# Patient Record
Sex: Female | Born: 1945 | Race: White | Hispanic: No | Marital: Married | Smoking: Never smoker
Health system: Southern US, Community
[De-identification: ages and names within clinical notes are randomized; demographics above are authoritative.]

## PROBLEM LIST (undated history)

## (undated) HISTORY — PX: APPENDECTOMY: SHX54

---

## 2016-01-02 DIAGNOSIS — E785 Hyperlipidemia, unspecified: Secondary | ICD-10-CM | POA: Insufficient documentation

## 2016-01-02 DIAGNOSIS — I493 Ventricular premature depolarization: Secondary | ICD-10-CM | POA: Insufficient documentation

## 2016-01-02 DIAGNOSIS — R739 Hyperglycemia, unspecified: Secondary | ICD-10-CM | POA: Insufficient documentation

## 2016-01-02 DIAGNOSIS — M810 Age-related osteoporosis without current pathological fracture: Secondary | ICD-10-CM | POA: Insufficient documentation

## 2016-01-04 DIAGNOSIS — E559 Vitamin D deficiency, unspecified: Secondary | ICD-10-CM | POA: Insufficient documentation

## 2018-01-08 DIAGNOSIS — I10 Essential (primary) hypertension: Secondary | ICD-10-CM | POA: Insufficient documentation

## 2019-01-18 DIAGNOSIS — L309 Dermatitis, unspecified: Secondary | ICD-10-CM | POA: Insufficient documentation

## 2020-11-21 DIAGNOSIS — K760 Fatty (change of) liver, not elsewhere classified: Secondary | ICD-10-CM | POA: Insufficient documentation

## 2021-03-01 ENCOUNTER — Encounter: Payer: Self-pay | Admitting: Internal Medicine

## 2021-03-19 DIAGNOSIS — R768 Other specified abnormal immunological findings in serum: Secondary | ICD-10-CM | POA: Insufficient documentation

## 2021-03-19 LAB — COMPREHENSIVE METABOLIC PANEL WITH GFR
Albumin: 4.1 (ref 3.5–5.0)
Calcium: 10.3 (ref 8.7–10.7)
GFR calc Af Amer: 87
GFR calc non Af Amer: 75
Globulin: 3.1

## 2021-03-19 LAB — BASIC METABOLIC PANEL WITH GFR
BUN: 13 (ref 4–21)
CO2: 26 — AB (ref 13–22)
Chloride: 96 — AB (ref 99–108)
Creatinine: 0.8 (ref 0.5–1.1)
Glucose: 99
Potassium: 4.2 (ref 3.4–5.3)
Sodium: 136 — AB (ref 137–147)

## 2021-03-19 LAB — HEPATIC FUNCTION PANEL
ALT: 18 (ref 7–35)
AST: 39 — AB (ref 13–35)
Alkaline Phosphatase: 58 (ref 25–125)
Bilirubin, Direct: 0.2 (ref 0.01–0.4)
Bilirubin, Total: 0.9

## 2021-03-19 LAB — CBC AND DIFFERENTIAL
HCT: 42 (ref 36–46)
Hemoglobin: 14 (ref 12.0–16.0)
Platelets: 121 — AB (ref 150–399)
WBC: 7.7

## 2021-03-19 LAB — LIPID PANEL
Cholesterol: 231 — AB (ref 0–200)
HDL: 84 — AB (ref 35–70)
LDL Cholesterol: 130
LDl/HDL Ratio: 1.5
Triglycerides: 86 (ref 40–160)

## 2021-03-19 LAB — CBC: RBC: 4.71 (ref 3.87–5.11)

## 2021-03-19 LAB — VITAMIN D 25 HYDROXY (VIT D DEFICIENCY, FRACTURES): Vit D, 25-Hydroxy: 250

## 2021-05-11 ENCOUNTER — Encounter: Payer: Self-pay | Admitting: Nurse Practitioner

## 2021-06-07 ENCOUNTER — Other Ambulatory Visit: Payer: Self-pay

## 2021-06-07 ENCOUNTER — Encounter: Payer: Self-pay | Admitting: Internal Medicine

## 2021-06-12 LAB — ANA: ANA SCREEN, IFA: POSITIVE

## 2021-06-12 NOTE — Progress Notes (Signed)
06/12/2021 Katherine Horne [id number] [birth year]   CHIEF COMPLAINT: Schedule a colonoscopy   HISTORY OF PRESENT ILLNESS: Katherine Horne is a 75 year old female with a past medical history of anxiety, hypertension, hyperlipidemia, ITP, osteoporosis and elevated LFTs.  S/P bright breast biopsy 2013 and C section 1981. She presents to our office today to schedule a screening colonoscopy as advised by her primary care physician Dr. Margaretha Sheffield. She also presents for further evaluation regarding elevated LFTs and elevated AMA levels as requested by her rheumatologist Dr. Ronnette Hila. She underwent a colonoscopy by Dr. Posey Pronto in [place] 11/2007 which she reported was normal, no polyps. She was due for a repeat  colonoscopy in 2019 which was not done due to the Covid 19 pandemic. She is passing normal Bms. Not daily. Soft to formed brown. No blood or black stools. No known family history of colorectal cancer.   She underwent laboratory studies 08/2020 which she reported showed elevated LFT. She underwent an abdominal sonogram at some point which showed evidence of a fatty liver ( abdominal sonogram report not in Epic or care everwhere). She recently underwent further laboratory studies by her PCP which showed a positive ANA level so she was referred to rheumatologist Dr. Scarlette Shorts.   Laboratory studies done by Dr. Scarlette Shorts 06/05/2021: WBC 9.3. Hg 13.7. HCT 41.6. PLT 134. BUN 10. Cr. 0.82. Na 136. K 4.4. Albumin 4.6. T. Bili 0.6. Alk phos 59. AST 35. ALT 20. Anti centromere B antibody > 8 (0.0 - 0.9). Hep B surface antigen negative. Hep B surface antibody nonreactive. Hep B core total negative. Hep C antibody < 0.1. CRP 14. TSH 1.730. B12 441. AMA 108.7 ( 0.0 - 20.0). Sed rate 16.  Laboratory studies 12/13/2020: Ferritin 303. Iron 120.Iron saturation 48%. TIBC 252. ANA positive. SMA < 1:20. A1AT 142. Ceruloplasmin 25. Alk phos 61. AST 123. ALT 83. Laboratory studies 11/14/2020: Alk phos 64. AST 140. ALT  95. Laboratory studies 10/16/2020: Alk phos 67. AST 140. ALT 68. T. Bili 0.5. Laboratory studies 09/11/2020: Alk phos 61. AST 78. ALT 39. T. Bili 0.6.   No family history of liver disease. No alcohol use.   She complains of having dysphagia which occurs once every 3 to 4 months for the past 2 years. She describes having food such a hamburger or bead which gets stuck to the upper esophagus which typically occurs when she eats while in her recliner. She is concerned enough about these episodes and questions if she needs to have her esophagus stretched. She denies ever having an EGD.   Social History: She is married. She is retired. He has 2 sons. She is retired. Infrequent alcohol use. No drug use.   Family History: Father with history of lung disease. Mother had heart issues, idiopathic pulmonary fibrosis. Son is diabetic with ESRD.     Outpatient Encounter Medications as of 06/13/2021  Medication Sig   denosumab (PROLIA) 60 MG/ML SOSY injection Prolia 60 mg/mL subcutaneous syringe  Inject 1 mL by subcutaneous route.   LORazepam (ATIVAN) 0.5 MG tablet 0.5 mg. Take 1 tablet every day as needed.   metoprolol succinate (TOPROL-XL) 25 MG 24 hr tablet Take 25 mg by mouth daily.   simvastatin (ZOCOR) 40 MG tablet Take 40 mg by mouth at bedtime.   telmisartan (MICARDIS) 40 MG tablet Take 40 mg by mouth daily.   No facility-administered encounter medications on file as of 06/13/2021.    REVIEW OF SYSTEMS:  Gen:  Denies fever, sweats or chills. No weight loss.  CV: Denies chest pain, palpitations or edema. Resp: Denies cough, shortness of breath of hemoptysis.  GI: See HPI.  GU : Denies urinary burning, blood in urine, increased urinary frequency or incontinence. MS: Denies joint pain, muscles aches or weakness. Derm: + rash and itchiness. No skin lesions or unhealing ulcers. No jaundice.  Psych: + Anxiety.  Heme: Denies bruising, bleeding. Neuro:  Denies headaches, dizziness or  paresthesias. Endo:  Denies any problems with DM, thyroid or adrenal function.   PHYSICAL EXAM: BP 130/60   Pulse (!) 50   Ht 5' 4" (1.626 m)   Wt 125 lb 9.6 oz (57 kg)   BMI 21.56 kg/m  General: 75 year old female in no acute distress. Head: Normocephalic and atraumatic. Eyes:  Sclerae non-icteric, conjunctive pink. Ears: Normal auditory acuity. Mouth: Upper and lower dentures. No ulcers or lesions.  Neck: Supple, no lymphadenopathy or thyromegaly.  Lungs: Clear bilaterally to auscultation without wheezes, crackles or rhonchi. Heart: Regular rate and rhythm. No murmur, rub or gallop appreciated.  Abdomen: Soft, nontender, nondistended. No masses. No hepatosplenomegaly. Normoactive bowel sounds x 4 quadrants. RLQ and central abdominal scars intact.  Rectal: Deferred.  Musculoskeletal: Symmetrical with no gross deformities. Skin: Warm and dry. No rash or lesions on visible extremities. Extremities: No edema. Neurological: Alert oriented x 4, no focal deficits.  Psychological:  Alert and cooperative. Normal mood and affect.  ASSESSMENT AND PLAN: 22) 75 year old female presents to schedule a screening colonoscopy. Normal colonoscopy reported in 2009, records not available for review.  -Colonoscopy benefits and risks discussed including risk with sedation, risk of bleeding, perforation and infection   2) History of hepatic steatosis. Elevated LFTs 08/2020 -> normal LFTs 06/05/2021 with elevated antimitochondrial antibody level. -Abdominal MRI/MRCP  -Liver biopsy deferred for now -Further recommendations to be determined after abdominal MRI/MRCP results reviewed  -Repeat hepatic panel with IgG and GGT level in 4 weeks   3) Dysphagia, intermittent which occurs mostly when eating in a recliner -Avoid eating in a recliner  -EGD with possible esophageal dilatations at time of colonoscopy benefits and risks discussed including risk with sedation, risk of bleeding, perforation and infection    4) Bradycardia, asymptomatic on Metoprolol for HTN  5) Thrombocytopenia, previously diagnosed with ITP -Await abdominal MRI/MRCP results to assess for any evidence of cirrhosis           CC:  No ref. provider found

## 2021-06-13 ENCOUNTER — Encounter: Payer: Self-pay | Admitting: Nurse Practitioner

## 2021-06-13 ENCOUNTER — Ambulatory Visit (INDEPENDENT_AMBULATORY_CARE_PROVIDER_SITE_OTHER): Payer: Medicare Other | Admitting: Nurse Practitioner

## 2021-06-13 VITALS — BP 130/60 | HR 50 | Ht 64.0 in | Wt 125.6 lb

## 2021-06-13 DIAGNOSIS — Z1211 Encounter for screening for malignant neoplasm of colon: Secondary | ICD-10-CM

## 2021-06-13 DIAGNOSIS — R7989 Other specified abnormal findings of blood chemistry: Secondary | ICD-10-CM

## 2021-06-13 DIAGNOSIS — R1310 Dysphagia, unspecified: Secondary | ICD-10-CM | POA: Diagnosis not present

## 2021-06-13 MED ORDER — PLENVU 140 G PO SOLR: ORAL | 0 refills | Status: DC

## 2021-06-13 NOTE — Progress Notes (Signed)
RADIOLOGY SCHEDULING REQUEST FOR Abd MRI MRCP W/WO contrast [facility] Scheduling via secure staff message.

## 2021-06-13 NOTE — Patient Instructions (Signed)
PROCEDURES: You have been scheduled for a colonoscopy. Please follow the written instructions given to you at your visit today. Please pick up your prep supplies at the pharmacy within the next 1-3 days. If you use inhalers (even only as needed), please bring them with you on the day of your procedure.  IMAGING: You will be contacted by [facility] Scheduling (Your caller ID will indicate phone # [phone]) in the next 2 days to schedule your Abdominal MRI. If you have not heard from them within 2 business days, please call [facility] Scheduling at [phone] to follow up on the status of your appointment.    It was great seeing you today! Thank you for entrusting me with your care and choosing [facility].  Arnaldo Natal, CRNP  The [facility] providers would like to encourage you to use [employer] to communicate with providers for non-urgent requests or questions.  Due to long hold times on the telephone, sending your provider a message by [facility] may be faster and more efficient way to get a response. Please allow 48 business hours for a response.  Please remember that this is for non-urgent requests/questions.  If you are age 54 or older, your body mass index should be between 23-30. Your Body mass index is 21.56 kg/m. If this is out of the aforementioned range listed, please consider follow up with your Primary Care Provider.  If you are age 75 or younger, your body mass index should be between 19-25. Your Body mass index is 21.56 kg/m. If this is out of the aformentioned range listed, please consider follow up with your Primary Care Provider.

## 2021-06-27 ENCOUNTER — Other Ambulatory Visit: Payer: Self-pay | Admitting: Nurse Practitioner

## 2021-06-27 ENCOUNTER — Ambulatory Visit (HOSPITAL_COMMUNITY)
Admission: RE | Admit: 2021-06-27 | Discharge: 2021-06-27 | Disposition: A | Discharge status: Home / Self Care | Payer: Medicare Other | Source: Ambulatory Visit | Attending: Nurse Practitioner | Admitting: Nurse Practitioner

## 2021-06-27 ENCOUNTER — Other Ambulatory Visit: Payer: Self-pay

## 2021-06-27 DIAGNOSIS — R1310 Dysphagia, unspecified: Secondary | ICD-10-CM | POA: Diagnosis present

## 2021-06-27 DIAGNOSIS — Z1211 Encounter for screening for malignant neoplasm of colon: Secondary | ICD-10-CM | POA: Insufficient documentation

## 2021-06-27 DIAGNOSIS — R7989 Other specified abnormal findings of blood chemistry: Secondary | ICD-10-CM | POA: Diagnosis present

## 2021-06-27 IMAGING — MR MR 3D RECON AT SCANNER
17 of 21 series · 38 of 48 positions shown · IV contrast (6ml GADAVIST)
Comparison: None.

CLINICAL DATA: Elevated liver function tests. Elevated anti
microbial antibodies.

EXAM:
MRI ABDOMEN WITHOUT AND WITH CONTRAST (INCLUDING MRCP)
TECHNIQUE: Multiplanar multisequence MR imaging of the abdomen was performed
both before and after the administration of intravenous contrast.
Heavily T2-weighted images of the biliary and pancreatic ducts were
obtained, and three-dimensional MRCP images were rendered by post
processing.
CONTRAST:  6mL GADAVIST GADOBUTROL 1 MMOL/ML IV SOLN

[Series 2: DWI · axial · 6.0mm · 1.36mm/px · z∈[-149,+74]mm · 2 of 64 slices shown (1 of 2)]
[im 1/64]
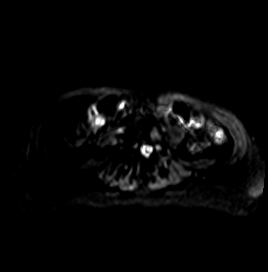
[im 64/64]
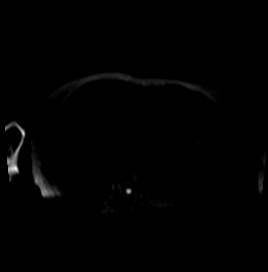

[Series 3: DWI · axial · 6.0mm · 1.36mm/px · 1 of 32 slices shown (2 of 2)]
[im 1/32]
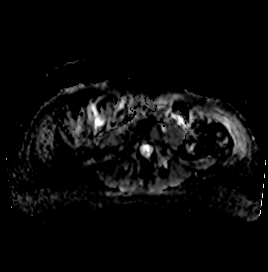

[Series 4: T2 fat-sat · axial · 6.0mm · 1.14mm/px · 1 of 30 slices shown]
[im 1/30]
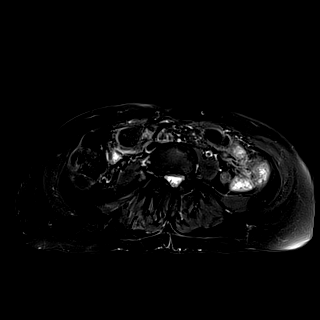

[Series 7: cor_3d_spc_trig · coronal · 1.0mm · 0.49mm/px · 3 of 72 slices shown]
[im 1/72]
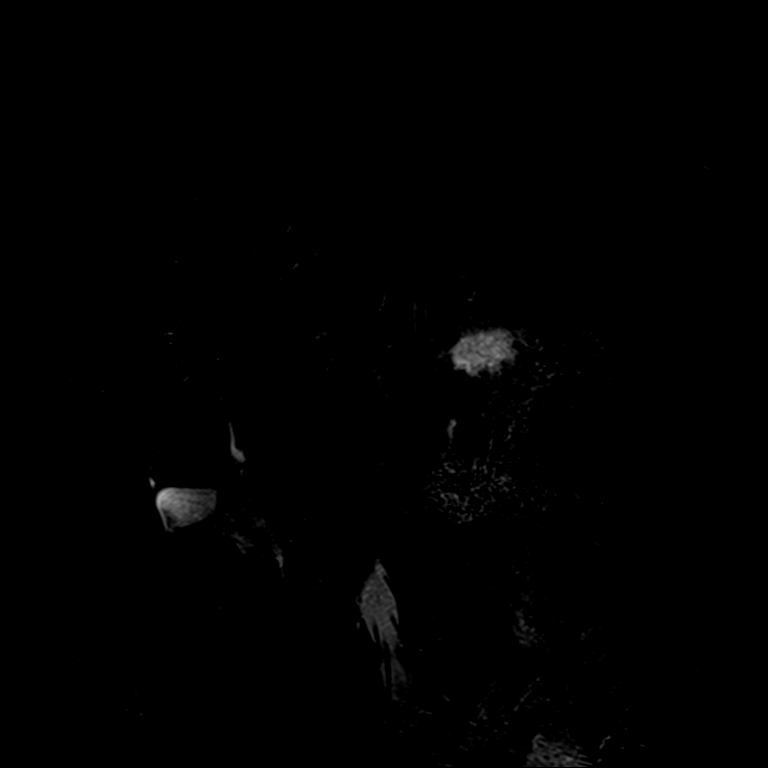
[im 36/72]
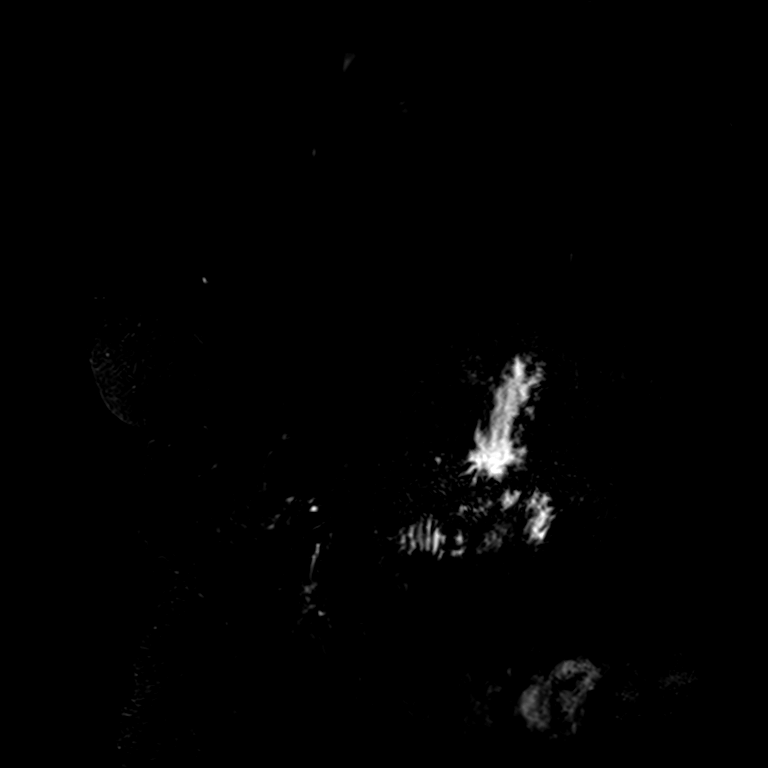
[im 72/72]
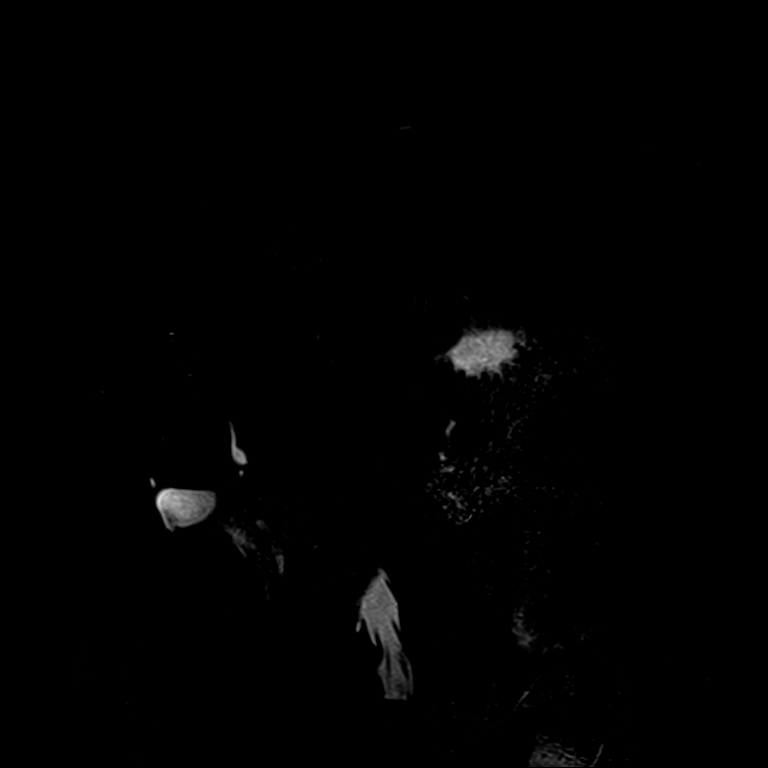

[Series 11: T2 · coronal · 6.0mm · 1.48mm/px · 1 of 33 slices shown (1 of 2)]
[im 1/33]
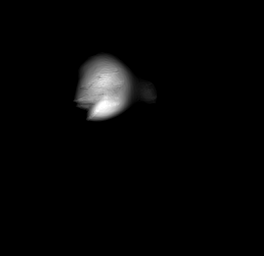

[Series 12: T1 · axial · 3.0mm · 1.09mm/px · z∈[-143,+70]mm · 3 of 72 slices shown (1 of 2)]
[im 1/72]
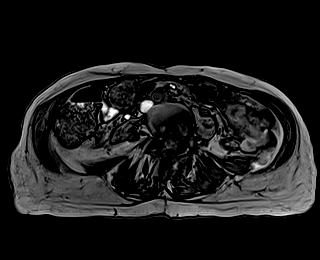
[im 36/72]
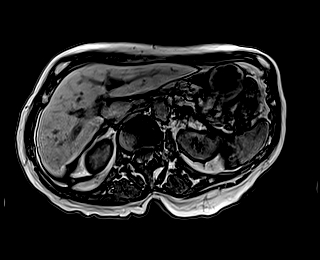
[im 72/72]
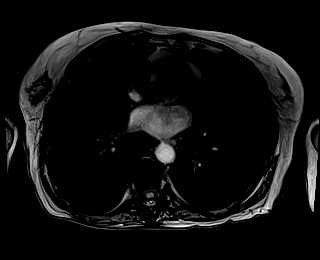

[Series 13: T1 · axial · 3.0mm · 1.09mm/px · z∈[-143,+70]mm · 3 of 72 slices shown (2 of 2)]
[im 1/72]
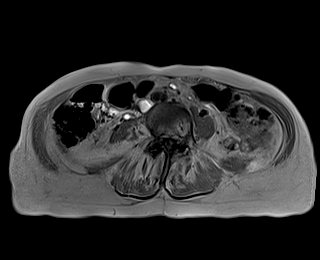
[im 36/72]
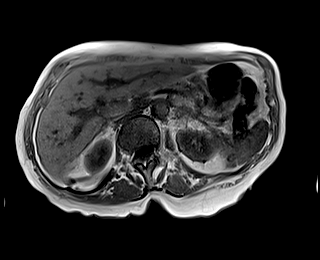
[im 72/72]
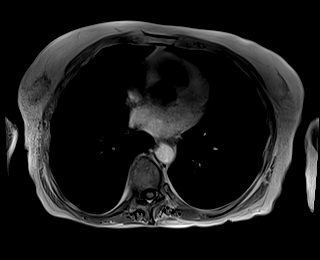

[Series 14: cor obl thk · sagittal · 50.0mm · 0.78mm/px · 1 of 9 slices shown]
[im 1/9]
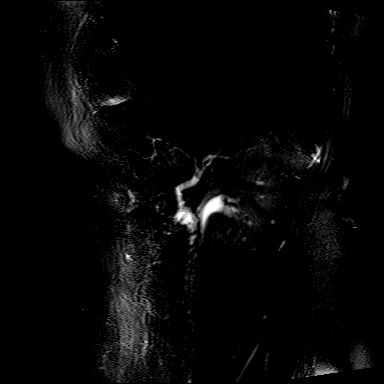

[Series 15: T2 · axial · 6.0mm · 1.37mm/px · 1 of 30 slices shown (2 of 2)]
[im 1/30]
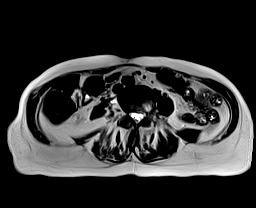

[Series 17: T1 dynamic · axial · 3.0mm · 1.09mm/px · z∈[-142,+71]mm · 3 of 72 slices shown (1 of 8)]
[im 1/72]
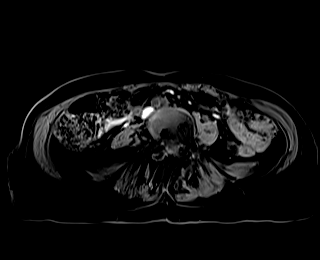
[im 36/72]
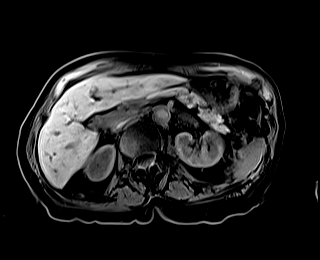
[im 72/72]
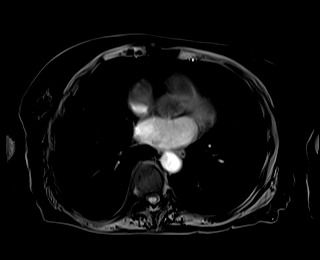

[Series 21: T1 dynamic · axial · 3.0mm · 1.09mm/px · z∈[-142,+71]mm · 3 of 72 slices shown (2 of 8)]
[im 1/72]
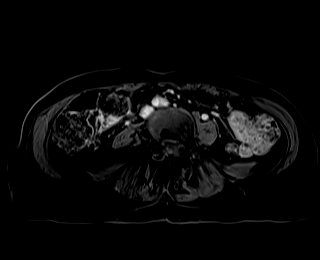
[im 36/72]
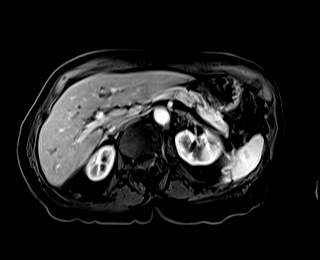
[im 72/72]
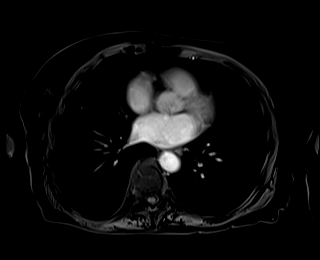

[Series 22: T1 dynamic · axial · 3.0mm · 1.09mm/px · z∈[-142,+71]mm · 3 of 72 slices shown (3 of 8)]
[im 1/72]
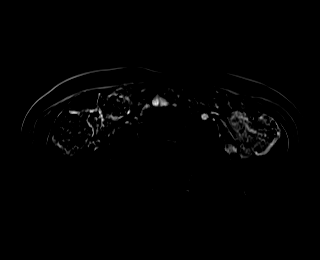
[im 36/72]
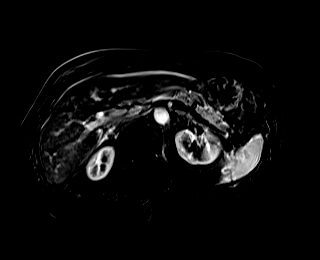
[im 72/72]
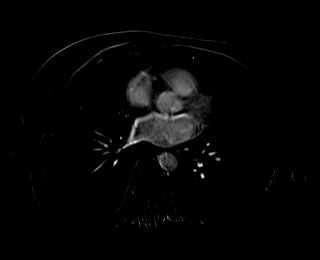

[Series 25: T1 dynamic · axial · 3.0mm · 1.09mm/px · z∈[-142,+71]mm · 3 of 72 slices shown (4 of 8)]
[im 1/72]
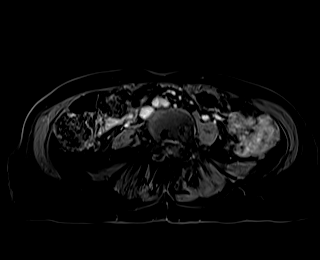
[im 36/72]
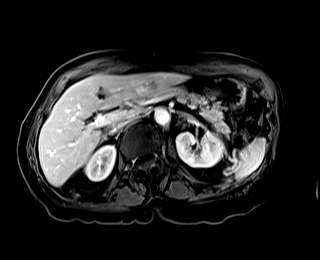
[im 72/72]
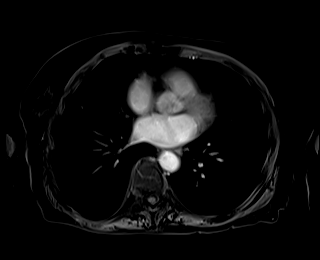

[Series 26: T1 dynamic · axial · 3.0mm · 1.09mm/px · z∈[-142,+71]mm · 3 of 72 slices shown (5 of 8)]
[im 1/72]
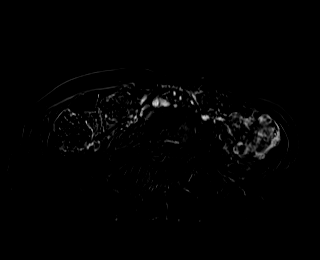
[im 36/72]
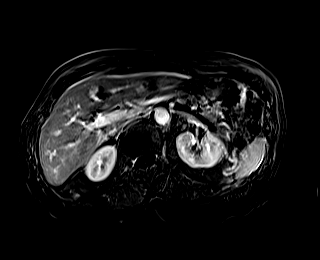
[im 72/72]
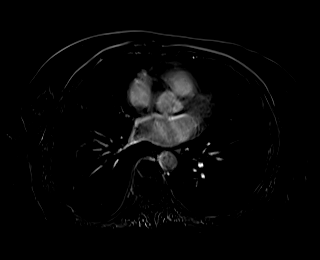

[Series 29: T1 dynamic · axial · 3.0mm · 1.09mm/px · z∈[-142,+71]mm · 3 of 72 slices shown (6 of 8)]
[im 1/72]
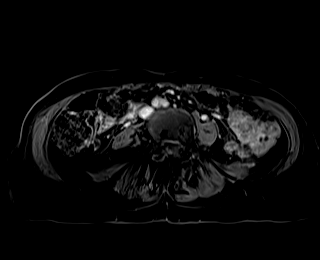
[im 36/72]
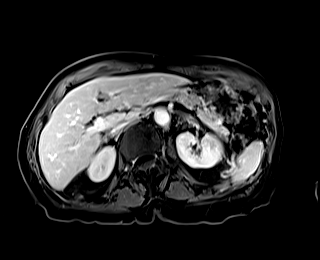
[im 72/72]
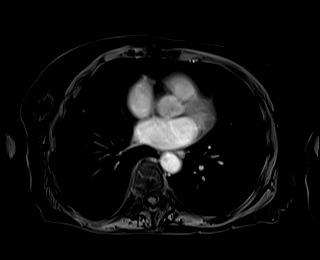

[Series 30: T1 dynamic · axial · 3.0mm · 1.09mm/px · z∈[-142,+71]mm · 3 of 72 slices shown (7 of 8)]
[im 1/72]
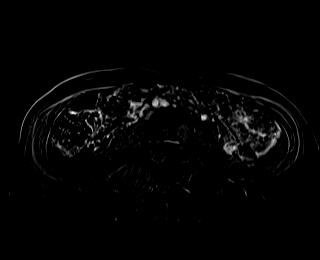
[im 36/72]
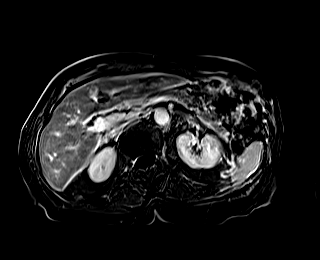
[im 72/72]
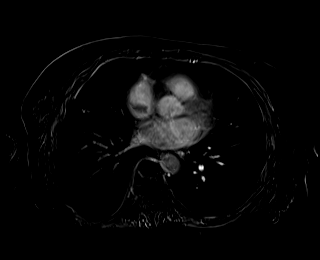

[Series 32: T1 dynamic · coronal · 5.0mm · 1.41mm/px · 1 of 44 slices shown (8 of 8)]
[im 1/44]
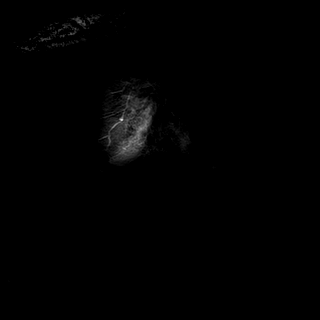

[38 of 48 positions shown; findings below may reference images not displayed]

FINDINGS: Lower chest: No acute findings.

Hepatobiliary: Hepatic parenchyma shows normal signal intensity. No
evidence of steatosis, and no gross morphologic findings of
cirrhosis identified. No foci of restricted diffusion is seen.
Gallbladder is unremarkable. No evidence of dilatation or strictures
involving the common bile duct or intrahepatic bile ducts.

Pancreas: No mass or inflammatory changes. No evidence of pancreatic
ductal dilatation or pancreas divisum.

Spleen:  Within normal limits in size and appearance.

Adrenals/Urinary Tract: No masses identified. A tiny sub-cm left
renal cyst is noted. No evidence of hydronephrosis.

Stomach/Bowel: Visualized portion unremarkable.

Vascular/Lymphatic: No pathologically enlarged lymph nodes
identified. No acute vascular findings.

Other:  None.

Musculoskeletal:  No suspicious bone lesions identified.
IMPRESSION: Negative. No hepatobiliary abnormality or other significant findings
identified.

## 2021-06-27 IMAGING — MR MR ABDOMEN WO/W CM MRCP
17 of 21 series · 38 of 48 positions shown · IV contrast (gadavist)
Comparison: None.

CLINICAL DATA: Elevated liver function tests. Elevated anti
microbial antibodies.

EXAM:
MRI ABDOMEN WITHOUT AND WITH CONTRAST (INCLUDING MRCP)
TECHNIQUE: Multiplanar multisequence MR imaging of the abdomen was performed
both before and after the administration of intravenous contrast.
Heavily T2-weighted images of the biliary and pancreatic ducts were
obtained, and three-dimensional MRCP images were rendered by post
processing.
CONTRAST:  6mL GADAVIST GADOBUTROL 1 MMOL/ML IV SOLN

[Series 2: DWI · axial · 6.0mm · 1.36mm/px · z∈[-149,+74]mm · 2 of 64 slices shown (1 of 2)]
[im 1/64]
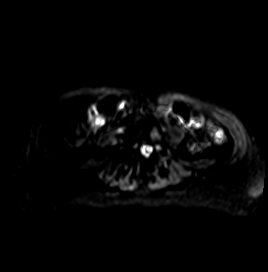
[im 64/64]
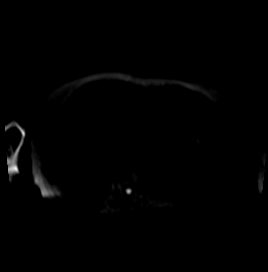

[Series 3: DWI · axial · 6.0mm · 1.36mm/px · 1 of 32 slices shown (2 of 2)]
[im 1/32]
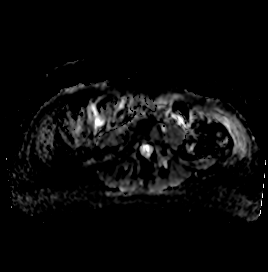

[Series 4: T2 fat-sat · axial · 6.0mm · 1.14mm/px · 1 of 30 slices shown]
[im 1/30]
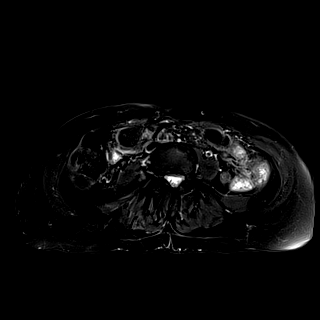

[Series 7: cor_3d_spc_trig · coronal · 1.0mm · 0.49mm/px · 3 of 72 slices shown]
[im 1/72]
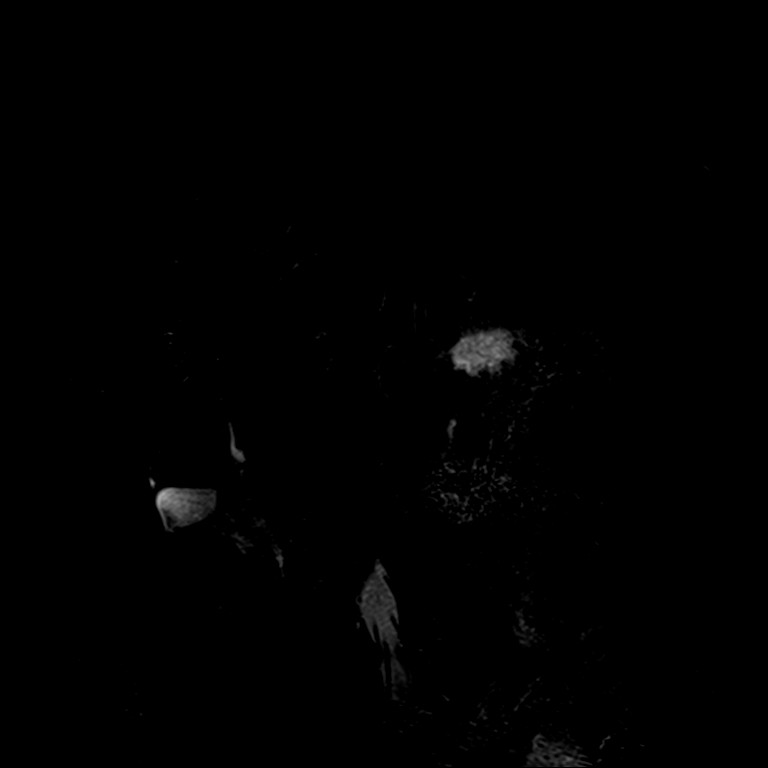
[im 36/72]
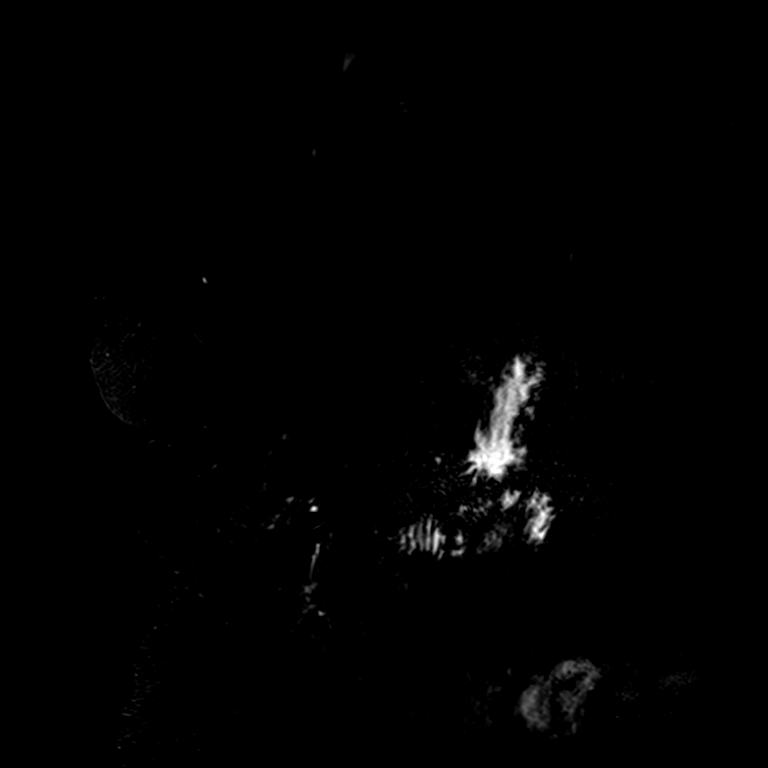
[im 72/72]
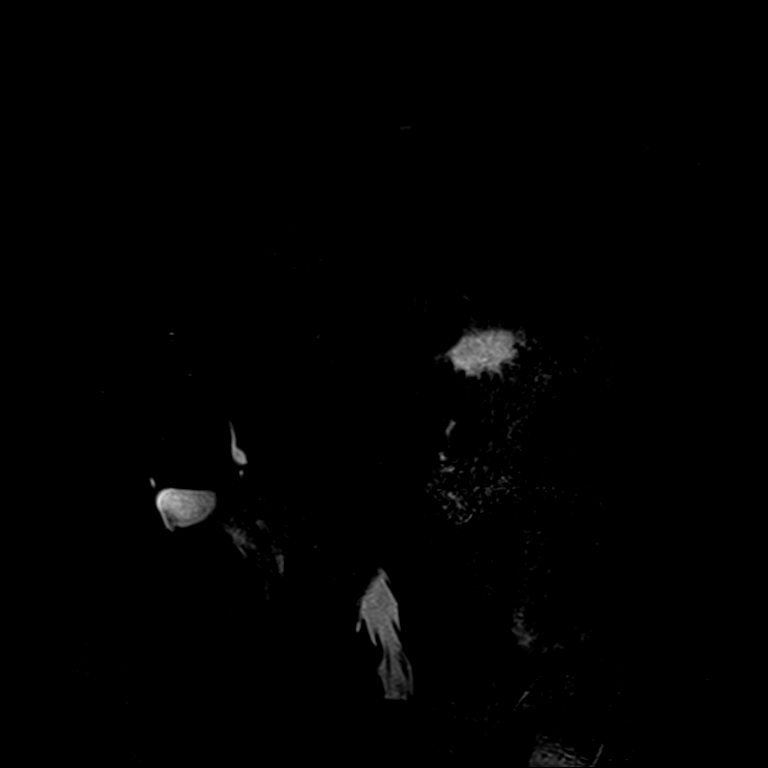

[Series 11: T2 · coronal · 6.0mm · 1.48mm/px · 1 of 33 slices shown (1 of 2)]
[im 1/33]
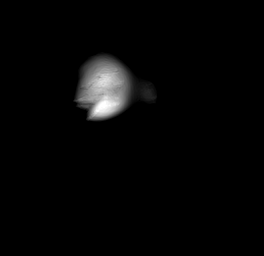

[Series 12: T1 · axial · 3.0mm · 1.09mm/px · z∈[-143,+70]mm · 3 of 72 slices shown (1 of 2)]
[im 1/72]
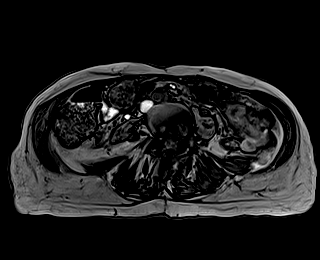
[im 36/72]
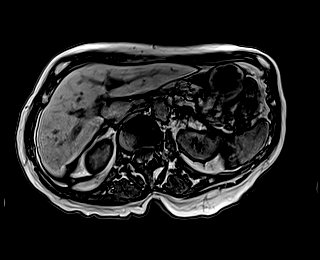
[im 72/72]
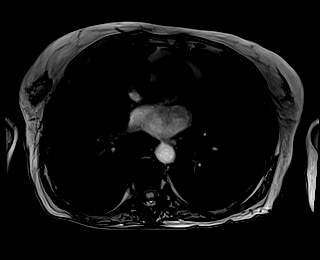

[Series 13: T1 · axial · 3.0mm · 1.09mm/px · z∈[-143,+70]mm · 3 of 72 slices shown (2 of 2)]
[im 1/72]
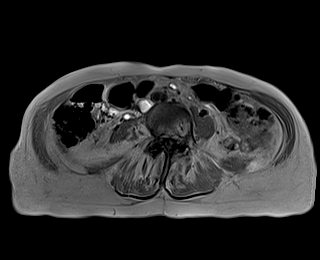
[im 36/72]
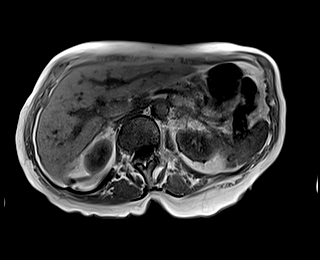
[im 72/72]
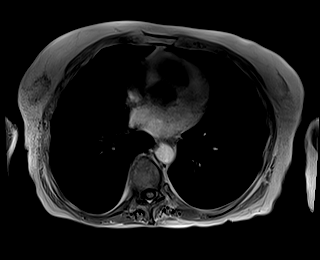

[Series 14: cor obl thk · sagittal · 50.0mm · 0.78mm/px · 1 of 9 slices shown]
[im 1/9]
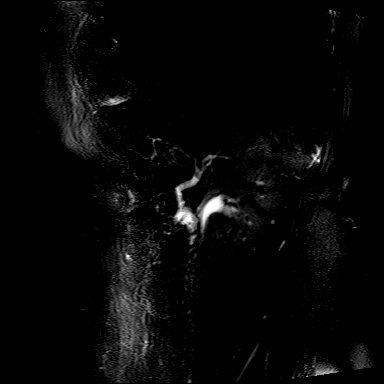

[Series 15: T2 · axial · 6.0mm · 1.37mm/px · 1 of 30 slices shown (2 of 2)]
[im 1/30]
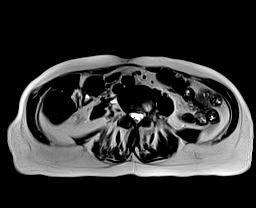

[Series 17: T1 dynamic · axial · 3.0mm · 1.09mm/px · z∈[-142,+71]mm · 3 of 72 slices shown (1 of 8)]
[im 1/72]
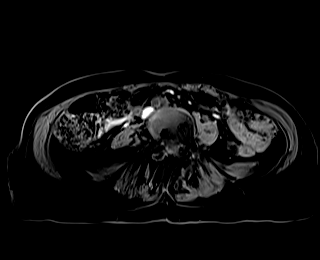
[im 36/72]
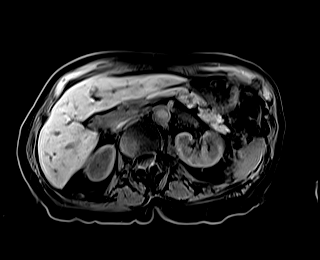
[im 72/72]
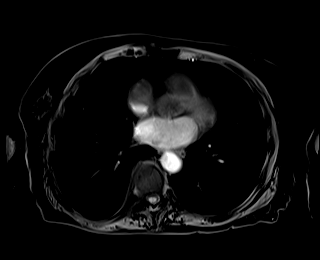

[Series 21: T1 dynamic · axial · 3.0mm · 1.09mm/px · z∈[-142,+71]mm · 3 of 72 slices shown (2 of 8)]
[im 1/72]
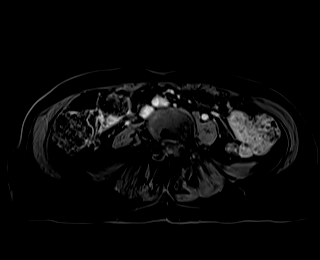
[im 36/72]
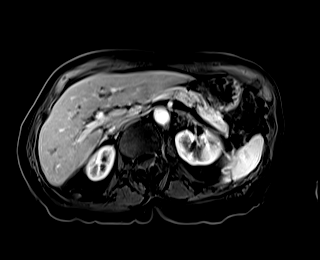
[im 72/72]
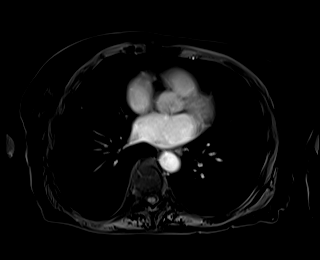

[Series 22: T1 dynamic · axial · 3.0mm · 1.09mm/px · z∈[-142,+71]mm · 3 of 72 slices shown (3 of 8)]
[im 1/72]
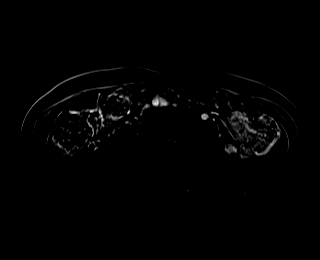
[im 36/72]
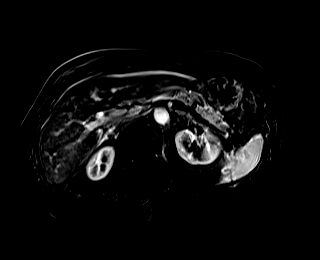
[im 72/72]
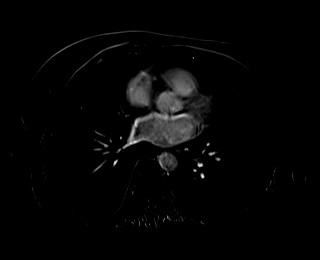

[Series 25: T1 dynamic · axial · 3.0mm · 1.09mm/px · z∈[-142,+71]mm · 3 of 72 slices shown (4 of 8)]
[im 1/72]
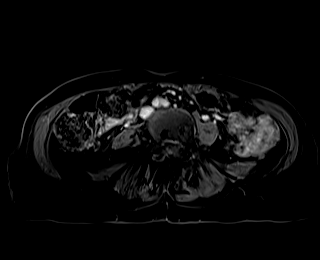
[im 36/72]
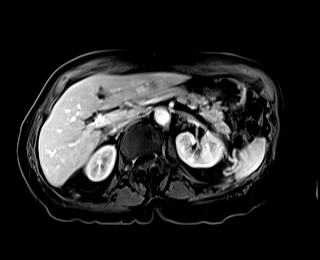
[im 72/72]
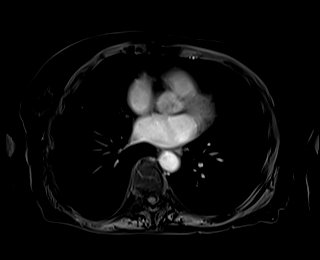

[Series 26: T1 dynamic · axial · 3.0mm · 1.09mm/px · z∈[-142,+71]mm · 3 of 72 slices shown (5 of 8)]
[im 1/72]
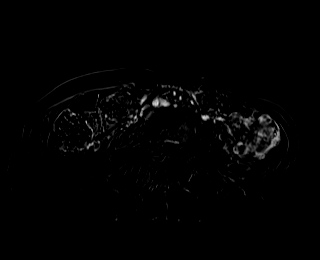
[im 36/72]
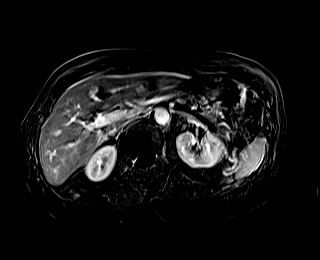
[im 72/72]
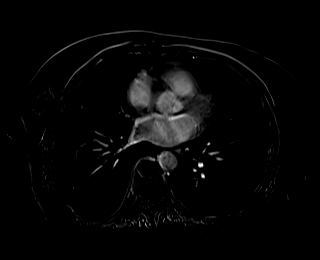

[Series 29: T1 dynamic · axial · 3.0mm · 1.09mm/px · z∈[-142,+71]mm · 3 of 72 slices shown (6 of 8)]
[im 1/72]
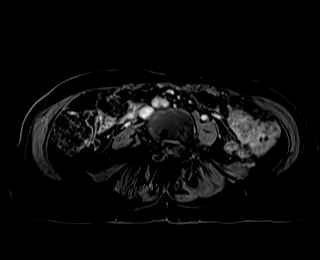
[im 36/72]
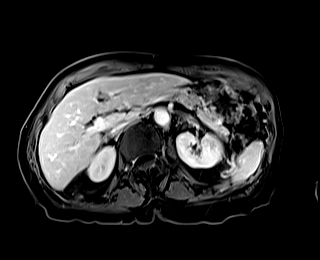
[im 72/72]
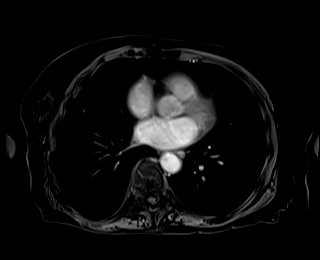

[Series 30: T1 dynamic · axial · 3.0mm · 1.09mm/px · z∈[-142,+71]mm · 3 of 72 slices shown (7 of 8)]
[im 1/72]
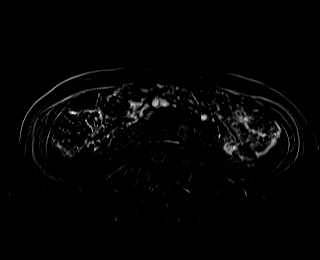
[im 36/72]
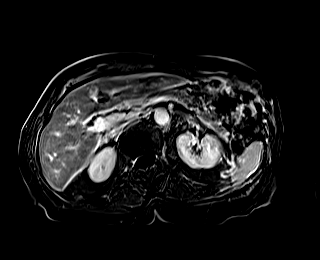
[im 72/72]
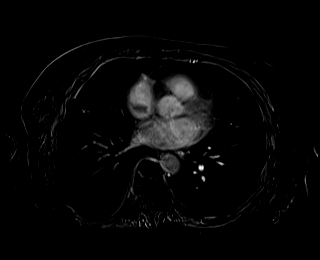

[Series 32: T1 dynamic · coronal · 5.0mm · 1.41mm/px · 1 of 44 slices shown (8 of 8)]
[im 1/44]
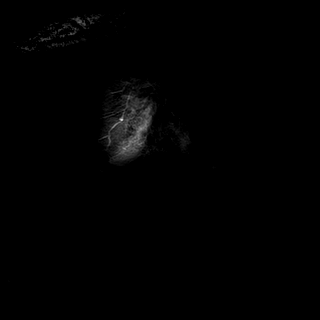

[38 of 48 positions shown; findings below may reference images not displayed]

FINDINGS: Lower chest: No acute findings.

Hepatobiliary: Hepatic parenchyma shows normal signal intensity. No
evidence of steatosis, and no gross morphologic findings of
cirrhosis identified. No foci of restricted diffusion is seen.
Gallbladder is unremarkable. No evidence of dilatation or strictures
involving the common bile duct or intrahepatic bile ducts.

Pancreas: No mass or inflammatory changes. No evidence of pancreatic
ductal dilatation or pancreas divisum.

Spleen:  Within normal limits in size and appearance.

Adrenals/Urinary Tract: No masses identified. A tiny sub-cm left
renal cyst is noted. No evidence of hydronephrosis.

Stomach/Bowel: Visualized portion unremarkable.

Vascular/Lymphatic: No pathologically enlarged lymph nodes
identified. No acute vascular findings.

Other:  None.

Musculoskeletal:  No suspicious bone lesions identified.
IMPRESSION: Negative. No hepatobiliary abnormality or other significant findings
identified.

## 2021-06-27 MED ORDER — GADOBUTROL 1 MMOL/ML IV SOLN
6.0000 mL | Freq: Once | INTRAVENOUS | Status: AC | PRN
  Administered 2021-06-27: 6 mL via INTRAVENOUS

## 2021-06-28 ENCOUNTER — Encounter: Payer: Medicare Other | Admitting: Gastroenterology

## 2021-07-03 ENCOUNTER — Other Ambulatory Visit: Payer: Self-pay

## 2021-07-03 DIAGNOSIS — R7989 Other specified abnormal findings of blood chemistry: Secondary | ICD-10-CM

## 2021-07-03 DIAGNOSIS — Z1211 Encounter for screening for malignant neoplasm of colon: Secondary | ICD-10-CM

## 2021-07-10 ENCOUNTER — Encounter: Payer: Medicare Other | Admitting: Internal Medicine

## 2021-07-18 ENCOUNTER — Other Ambulatory Visit (INDEPENDENT_AMBULATORY_CARE_PROVIDER_SITE_OTHER): Payer: Medicare Other

## 2021-07-18 DIAGNOSIS — R7989 Other specified abnormal findings of blood chemistry: Secondary | ICD-10-CM | POA: Diagnosis not present

## 2021-07-18 LAB — HEPATIC FUNCTION PANEL
ALT: 21 U/L (ref 0–35)
AST: 34 U/L (ref 0–37)
Albumin: 4.3 g/dL (ref 3.5–5.2)
Alkaline Phosphatase: 40 U/L (ref 39–117)
Bilirubin, Direct: 0.1 mg/dL (ref 0.0–0.3)
Total Bilirubin: 0.6 mg/dL (ref 0.2–1.2)
Total Protein: 7.8 g/dL (ref 6.0–8.3)

## 2021-07-19 ENCOUNTER — Telehealth: Payer: Self-pay

## 2021-07-19 LAB — IGA: Immunoglobulin A: 385 mg/dL — ABNORMAL HIGH (ref 70–320)

## 2021-07-19 NOTE — Telephone Encounter (Signed)
-----   Message from Arnaldo Natal, NP sent at 07/18/2021  2:59 PM EDT ----- Katherine Horne, pls inform the patient her liver blood tests were normal. She should continue to follow up with her rheumatologist and to contact our office if future blood tests show recurrence of elevated LFTs.   MRCP was negative which was reassuring.   Thx   Dr. Leone Payor refer to office visit 06/13/2021. FYI, MRCP was negative. Repeat hepatic panel WNLs.

## 2021-07-20 NOTE — Telephone Encounter (Signed)
LM for patient to call back.

## 2021-07-20 NOTE — Telephone Encounter (Signed)
Notified patient of test results , patient expressed understanding and agreement, no further questions at this time. Patient has requested a copy of the results mailed to her. I have printed and mailed.

## 2024-10-21 ENCOUNTER — Other Ambulatory Visit: Payer: Self-pay
